# Patient Record
Sex: Female | Born: 1965 | Race: White | Hispanic: No | Marital: Married | Smoking: Never smoker
Health system: Southern US, Community
[De-identification: ages and names within clinical notes are randomized; demographics above are authoritative.]

---

## 1997-11-29 ENCOUNTER — Other Ambulatory Visit: Admission: RE | Admit: 1997-11-29 | Discharge: 1997-11-29 | Payer: Self-pay | Admitting: Gynecology

## 1999-05-09 ENCOUNTER — Other Ambulatory Visit: Admission: RE | Admit: 1999-05-09 | Discharge: 1999-05-09 | Payer: Self-pay | Admitting: Gynecology

## 1999-09-09 ENCOUNTER — Other Ambulatory Visit: Admission: RE | Admit: 1999-09-09 | Discharge: 1999-09-09 | Payer: Self-pay | Admitting: Gynecology

## 1999-09-26 ENCOUNTER — Other Ambulatory Visit: Admission: RE | Admit: 1999-09-26 | Discharge: 1999-09-26 | Payer: Self-pay | Admitting: Gynecology

## 1999-09-26 ENCOUNTER — Encounter (INDEPENDENT_AMBULATORY_CARE_PROVIDER_SITE_OTHER): Payer: Self-pay

## 2000-04-06 ENCOUNTER — Other Ambulatory Visit: Admission: RE | Admit: 2000-04-06 | Discharge: 2000-04-06 | Payer: Self-pay | Admitting: Gynecology

## 2001-08-01 ENCOUNTER — Other Ambulatory Visit: Admission: RE | Admit: 2001-08-01 | Discharge: 2001-08-01 | Payer: Self-pay | Admitting: Gynecology

## 2002-02-03 ENCOUNTER — Ambulatory Visit (HOSPITAL_BASED_OUTPATIENT_CLINIC_OR_DEPARTMENT_OTHER): Admission: RE | Admit: 2002-02-03 | Discharge: 2002-02-03 | Payer: Self-pay | Admitting: Urology

## 2002-02-03 ENCOUNTER — Encounter (INDEPENDENT_AMBULATORY_CARE_PROVIDER_SITE_OTHER): Payer: Self-pay | Admitting: Specialist

## 2002-08-17 ENCOUNTER — Other Ambulatory Visit: Admission: RE | Admit: 2002-08-17 | Discharge: 2002-08-17 | Payer: Self-pay | Admitting: Gynecology

## 2003-01-28 ENCOUNTER — Emergency Department (HOSPITAL_COMMUNITY): Admission: EM | Admit: 2003-01-28 | Discharge: 2003-01-28 | Payer: Self-pay | Admitting: Emergency Medicine

## 2003-09-20 ENCOUNTER — Other Ambulatory Visit: Admission: RE | Admit: 2003-09-20 | Discharge: 2003-09-20 | Payer: Self-pay | Admitting: Gynecology

## 2004-04-03 ENCOUNTER — Encounter: Admission: RE | Admit: 2004-04-03 | Discharge: 2004-04-03 | Payer: Self-pay | Admitting: Gynecology

## 2004-10-20 ENCOUNTER — Other Ambulatory Visit: Admission: RE | Admit: 2004-10-20 | Discharge: 2004-10-20 | Payer: Self-pay | Admitting: Gynecology

## 2005-08-27 ENCOUNTER — Encounter: Admission: RE | Admit: 2005-08-27 | Discharge: 2005-08-27 | Payer: Self-pay | Admitting: Otolaryngology

## 2005-11-10 ENCOUNTER — Other Ambulatory Visit: Admission: RE | Admit: 2005-11-10 | Discharge: 2005-11-10 | Payer: Self-pay | Admitting: Gynecology

## 2006-06-02 ENCOUNTER — Other Ambulatory Visit: Admission: RE | Admit: 2006-06-02 | Discharge: 2006-06-02 | Payer: Self-pay | Admitting: Gynecology

## 2006-07-23 IMAGING — CT CT PARANASAL SINUSES LIMITED
1 series · 16 of 20 positions shown, 20 images · non-contrast
Comparison: none

CLINICAL DATA: Chronic sinusitis symptoms with bilateral maxillary pain.
 LIMITED CT OF PARANASAL SINUSES:
TECHNIQUE: Limited coronal CT images were obtained through the paranasal sinuses without intravenous contrast.
 No comparison.

[Series 2: prone coronal · axial · 0.33mm/px · z∈[+15,+84]mm · 16 of 20 slices shown, 20 images]
[im 2/20  brain]
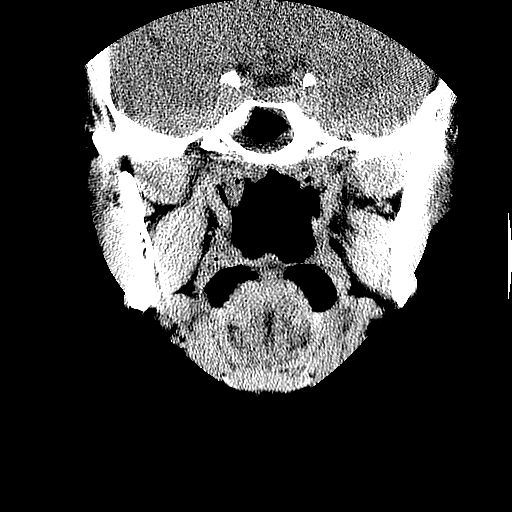
[im 2/20  bone]
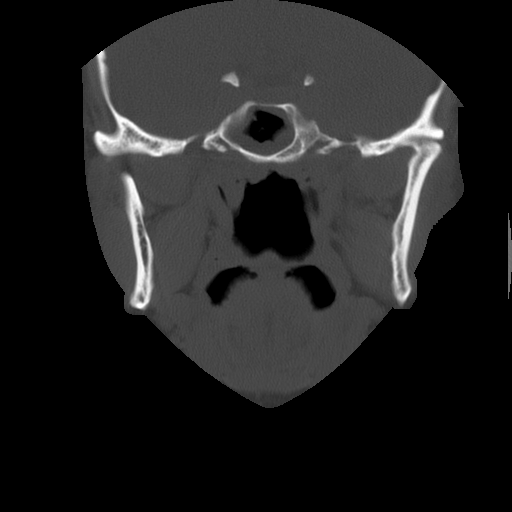
[im 3/20  bone]
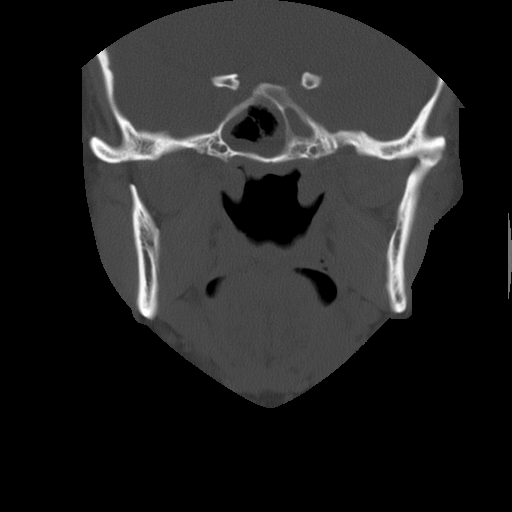
[im 4/20  bone]
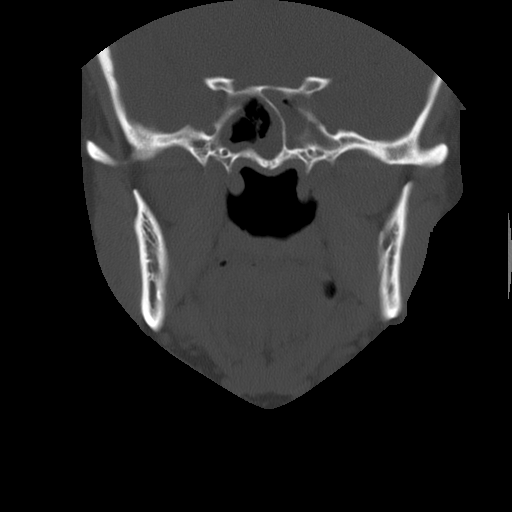
[im 5/20  bone]
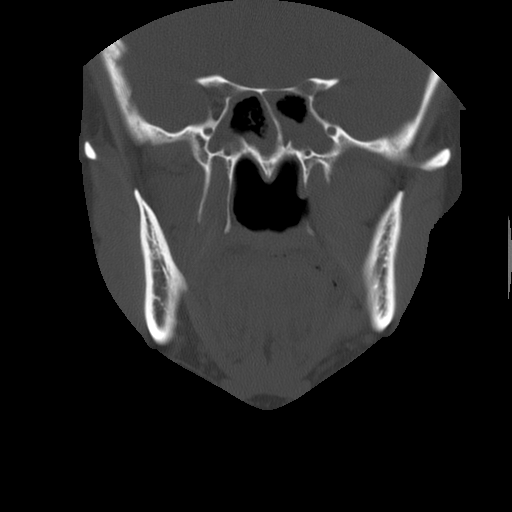
[im 7/20  brain]
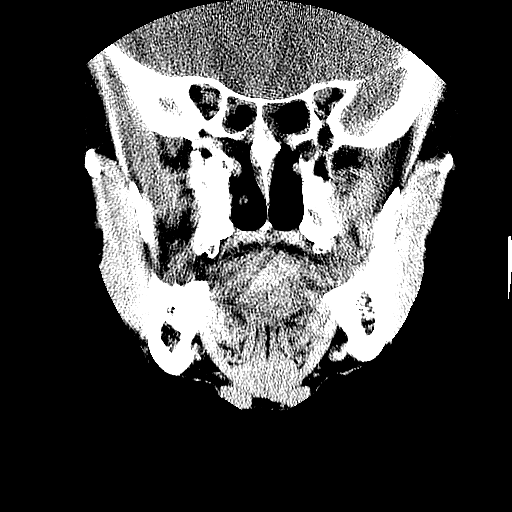
[im 7/20  bone]
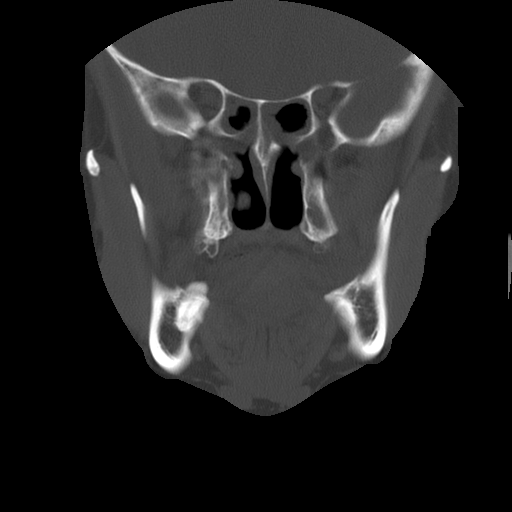
[im 8/20  bone]
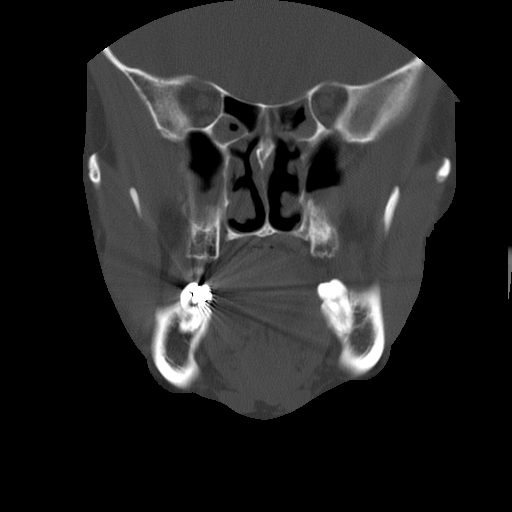
[im 9/20  bone]
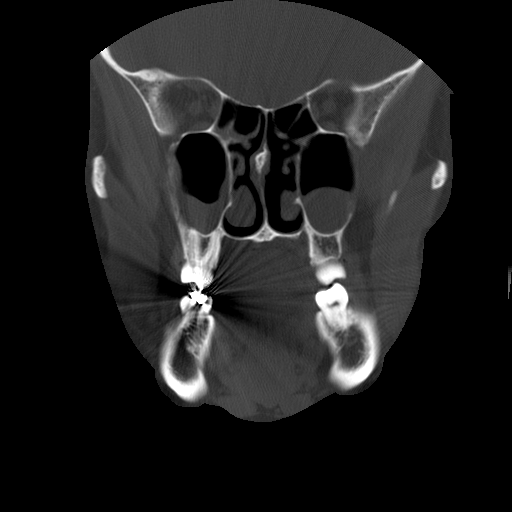
[im 10/20  bone]
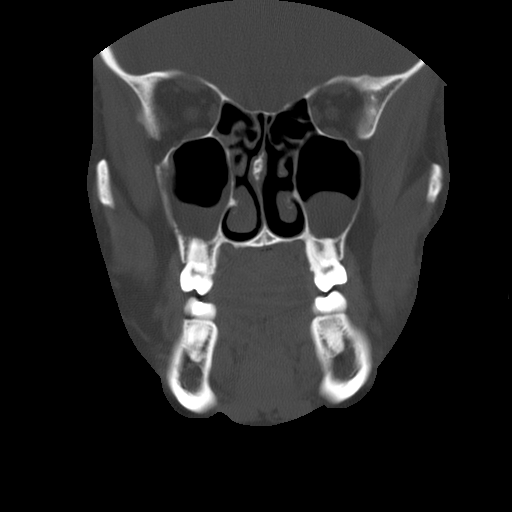
[im 11/20  brain]
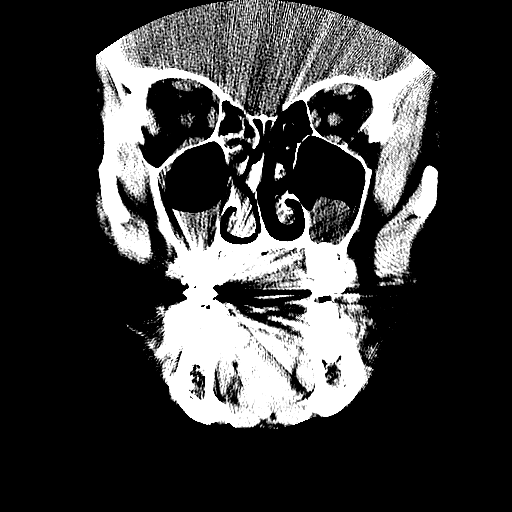
[im 11/20  bone]
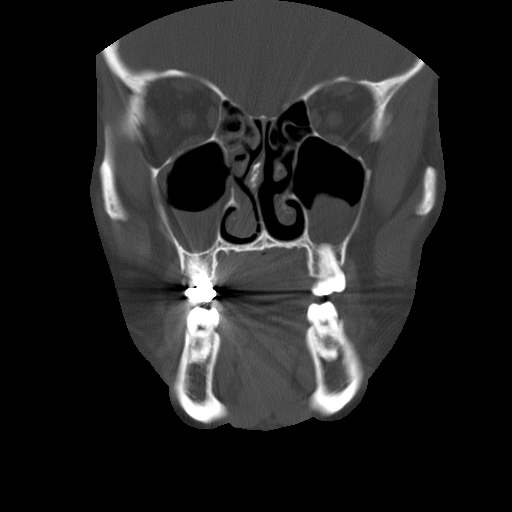
[im 12/20  bone]
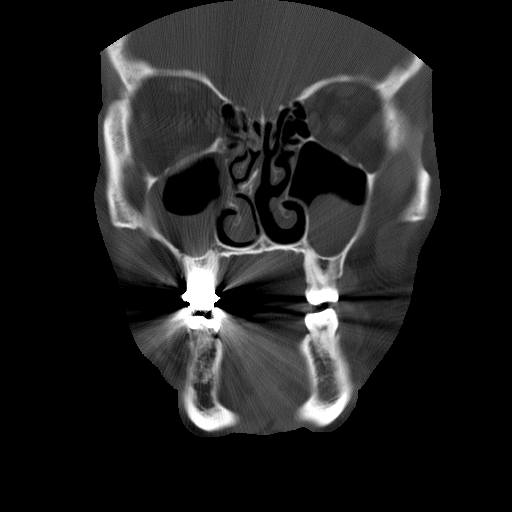
[im 13/20  bone]
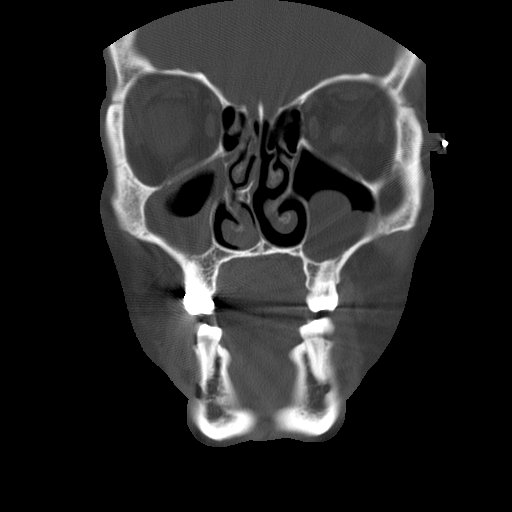
[im 14/20  bone]
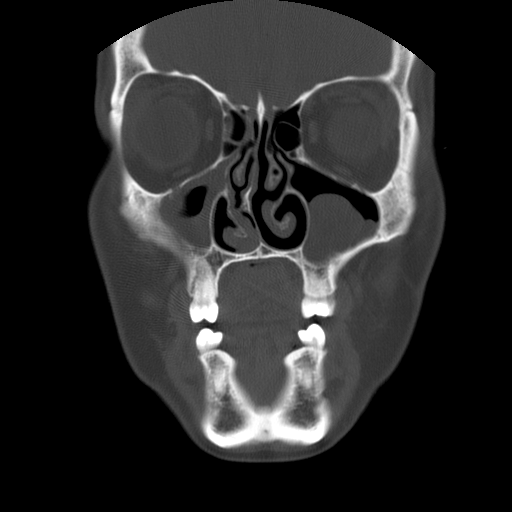
[im 16/20  brain]
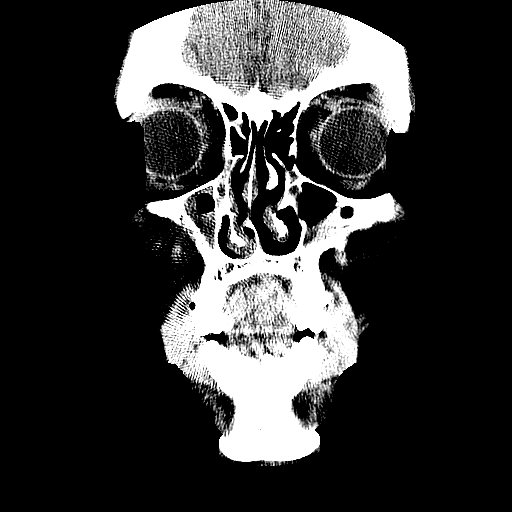
[im 16/20  bone]
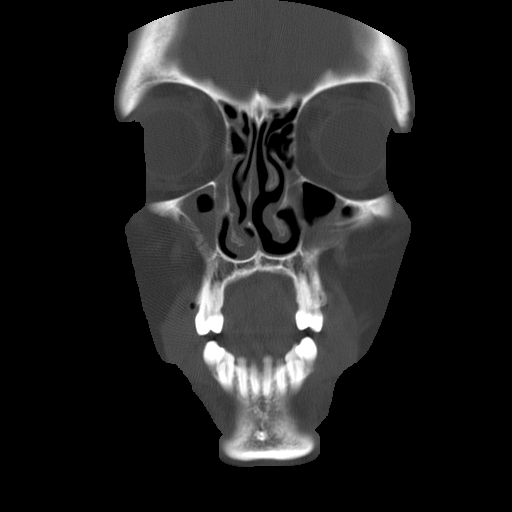
[im 17/20  bone]
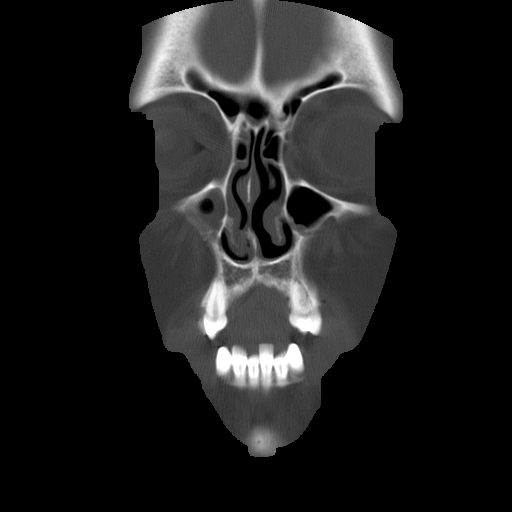
[im 18/20  bone]
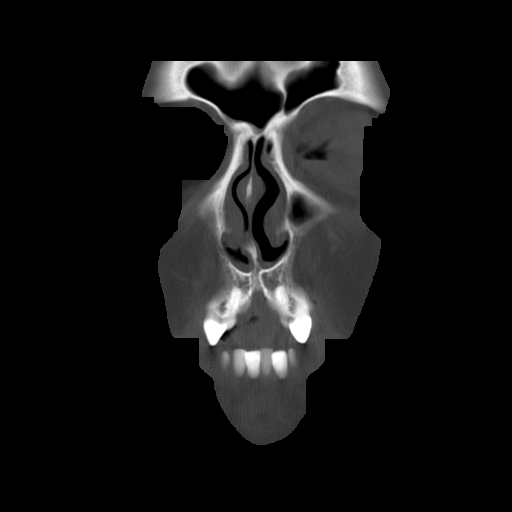
[im 19/20  bone]
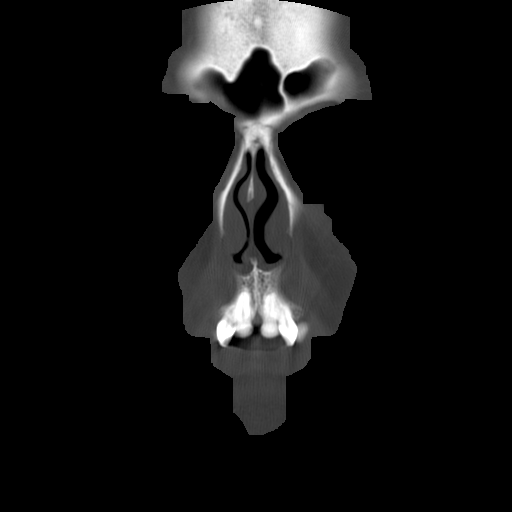

[16 of 20 positions shown; findings below may reference images not displayed]

FINDINGS: Moderate bilateral maxillary with slight to moderate bilateral sphenoid, slight ethmoid and minimal left frontal chronic sinusitis findings are seen.  Slight leftward nasal septal deviation is seen with inflammatory enlargement of the left inferior nasal turbinate.  The right frontal sinus is clear with no other significant abnormality seen.
IMPRESSION: 1.  Slight to moderate chronic pansinusitis most marked at the bilateral maxillary with sparing of the right frontal sinus.
 2.  Slight leftward nasal septal deviation and inflammatory enlargement of left inferior nasal turbinate. 
 3.  Otherwise no significant abnormality.

## 2006-08-27 ENCOUNTER — Emergency Department (HOSPITAL_COMMUNITY): Admission: EM | Admit: 2006-08-27 | Discharge: 2006-08-27 | Payer: Self-pay | Admitting: Family Medicine

## 2007-03-08 ENCOUNTER — Other Ambulatory Visit: Admission: RE | Admit: 2007-03-08 | Discharge: 2007-03-08 | Payer: Self-pay | Admitting: Gynecology

## 2007-08-23 ENCOUNTER — Other Ambulatory Visit: Admission: RE | Admit: 2007-08-23 | Discharge: 2007-08-23 | Payer: Self-pay | Admitting: Gynecology

## 2008-07-11 ENCOUNTER — Other Ambulatory Visit: Admission: RE | Admit: 2008-07-11 | Discharge: 2008-07-11 | Payer: Self-pay | Admitting: Gynecology

## 2010-07-22 ENCOUNTER — Encounter
Admission: RE | Admit: 2010-07-22 | Discharge: 2010-07-22 | Payer: Self-pay | Source: Home / Self Care | Attending: Gynecology | Admitting: Gynecology

## 2010-07-30 ENCOUNTER — Encounter: Payer: Self-pay | Admitting: Gynecology

## 2010-11-14 NOTE — Op Note (Signed)
TNAMEValeta, Paz NO.:  [id number]   MEDICAL RECORD NO.:  [id number]                    PATIENT TYPE:   LOCATION:                                       FACILITY:   PHYSICIAN:  Ronald L. Ovidio Hanger, M.D.           DATE OF BIRTH:   DATE OF PROCEDURE:  02/03/2002  DATE OF DISCHARGE:                                 OPERATIVE REPORT   DIAGNOSIS:  Bladder symptoms, consistent with interstitial cystitis.   OPERATIVE PROCEDURE:  Cystourethroscopy, hydraulic bladder distention, and  bladder biopsy.   SURGEON:  Lucrezia Starch. Earlene Plater, M.D.   ASSISTANT:  Crecencio Mc, M.D.   ANESTHESIA:  General laryngeal airway.   ESTIMATED BLOOD LOSS:  Negligible.   TUBES:  None.   COMPLICATIONS:  None.   INDICATIONS FOR PROCEDURE:  This patient is a lovely 45 year old white  female who presented with symptoms of recurrent urinary tract infection  which has been culture negative.  She has had suprapubic pressure, dysuria,  dyspareunia, and two episodes of urge incontinence, despite having this  negative culture.  She has been having infections on and off since she was  45 years old.  She also has had some right flank pain, although her  ultrasound of her kidneys was normal.  I discussed the situation in detail.  The symptoms are very suspicious for interstitial cystitis, and after  understanding the risks, benefits, and alternatives, she elected to undergo  the above procedure for both therapeutic and diagnostic purposes.   PROCEDURE IN DETAIL:  The patient was placed in the supine position after  proper general laryngeal airway anesthesia.  She was placed in the dorsal  lithotomy position and prepped and draped in a sterile fashion.  A  cystourethroscopy was performed with a 22.5 French Olympus panendoscope,  utilizing the 12- and 70-degree lenses.  The bladder was carefully  inspected.  Efflux of clear urine was noted from the normally placed  ureteral orifices  bilaterally, and there were no lesions in the bladder and  the urethra.  Hydraulic bladder distention was then performed to 80 cm of  water pressure.  She was noted to have a 600 cc capacity bladder, and after  draining, there were definite glomerulations posteriorly and laterally,  trigone, posterior wall, and lateral bladder walls.  The dome appeared to be  spared.  A biopsy was then obtained in the posterior midline with the cold  cup biopsy forceps.  There were glomerulations in this area, and this was  submitted for pathology.  The base was cauterized with Bovie coagulation  cautery.  The bladder was drained.  The panendoscope was removed.  Examination under anesthesia revealed a palpably normal uterus, tubes,  ovaries, etc.  No other lesions were noted.  The patient tolerated the  procedure well and was taken to the recovery room stable.  Ronald L. Ovidio Hanger, M.D.   RLD/MEDQ  D:  02/03/2002  T:  02/05/2002  Job:  [id number]

## 2013-04-03 ENCOUNTER — Other Ambulatory Visit: Payer: Self-pay | Admitting: Gynecology

## 2014-04-30 ENCOUNTER — Other Ambulatory Visit: Payer: Self-pay | Admitting: Gynecology

## 2014-05-01 LAB — CYTOLOGY - PAP

## 2014-11-06 ENCOUNTER — Other Ambulatory Visit: Payer: Self-pay

## 2014-11-07 LAB — CYTOLOGY - PAP

## 2014-11-22 ENCOUNTER — Other Ambulatory Visit: Payer: Self-pay | Admitting: Gynecology

## 2015-03-22 ENCOUNTER — Ambulatory Visit (HOSPITAL_COMMUNITY): Payer: Self-pay | Admitting: Psychiatry

## 2017-04-30 ENCOUNTER — Emergency Department (HOSPITAL_COMMUNITY)
Admission: EM | Admit: 2017-04-30 | Discharge: 2017-04-30 | Disposition: A | Discharge status: Home / Self Care | Payer: BLUE CROSS/BLUE SHIELD | Attending: Emergency Medicine | Admitting: Emergency Medicine

## 2017-04-30 ENCOUNTER — Emergency Department (HOSPITAL_COMMUNITY): Payer: BLUE CROSS/BLUE SHIELD

## 2017-04-30 ENCOUNTER — Encounter (HOSPITAL_COMMUNITY): Payer: Self-pay | Admitting: Emergency Medicine

## 2017-04-30 DIAGNOSIS — M940 Chondrocostal junction syndrome [Tietze]: Secondary | ICD-10-CM | POA: Diagnosis not present

## 2017-04-30 DIAGNOSIS — R0789 Other chest pain: Secondary | ICD-10-CM | POA: Diagnosis present

## 2017-04-30 DIAGNOSIS — R079 Chest pain, unspecified: Secondary | ICD-10-CM

## 2017-04-30 LAB — CBC
HCT: 45.8 % (ref 36.0–46.0)
Hemoglobin: 15.7 g/dL — ABNORMAL HIGH (ref 12.0–15.0)
MCH: 29.7 pg (ref 26.0–34.0)
MCHC: 34.3 g/dL (ref 30.0–36.0)
MCV: 86.7 fL (ref 78.0–100.0)
Platelets: 264 10*3/uL (ref 150–400)
RBC: 5.28 MIL/uL — ABNORMAL HIGH (ref 3.87–5.11)
RDW: 12.9 % (ref 11.5–15.5)
WBC: 7 10*3/uL (ref 4.0–10.5)

## 2017-04-30 LAB — BASIC METABOLIC PANEL
Anion gap: 7 (ref 5–15)
BUN: 14 mg/dL (ref 6–20)
CO2: 26 mmol/L (ref 22–32)
Calcium: 9.7 mg/dL (ref 8.9–10.3)
Chloride: 106 mmol/L (ref 101–111)
Creatinine, Ser: 0.87 mg/dL (ref 0.44–1.00)
GFR calc Af Amer: >60 mL/min (ref >60)
GFR calc non Af Amer: >60 mL/min (ref >60)
Glucose, Bld: 95 mg/dL (ref 65–99)
Potassium: 4.1 mmol/L (ref 3.5–5.1)
Sodium: 139 mmol/L (ref 135–145)

## 2017-04-30 LAB — I-STAT TROPONIN, ED
Troponin i, poc: 0 ng/mL (ref 0.00–0.08)
Troponin i, poc: 0 ng/mL (ref 0.00–0.08)

## 2017-04-30 MED ORDER — MORPHINE SULFATE (PF) 4 MG/ML IV SOLN: 4.0000 mg | Freq: Once | INTRAVENOUS | Status: DC

## 2017-04-30 MED ORDER — GI COCKTAIL ~~LOC~~
30.0000 mL | Freq: Once | ORAL | Status: AC
  Administered 2017-04-30: 30 mL via ORAL
  Filled 2017-04-30: qty 30

## 2017-04-30 NOTE — ED Triage Notes (Signed)
Pt to ER for evaluation of left chest pressure onset yesterday on awakening with nausea. Was seen at PCP and sent here for further evaluation. States took 4 baby aspirin this morning. NAD.

## 2017-04-30 NOTE — ED Notes (Signed)
No relief of symptoms post gi cocktail

## 2017-04-30 NOTE — ED Provider Notes (Signed)
[facility] Note   CSN: [id number] Arrival date & time: 04/30/17  1014     History   Chief Complaint Chief Complaint  Patient presents with  . Chest Pain    HPI Yolanda Warren is a 51 y.o. female with a PMHx of cervical dysplasia, infertility, HLD, diverticulitis, and hiatal hernia, who presents to the ED with complaints of left-sided chest pain that began around 3 AM yesterday while she was sleeping. She states that she woke up with some mild discomfort, went back to sleep, but when she woke back up in the morning it continued to be present so she went to her PCP just prior to arrival and they sent her here for further evaluation. She describes her pain as 4/10 constant heaviness in the left side of her chest with intermittent sharp 8/10 pain also in the left chest, which is nonradiating, with no known aggravating factors, occurring randomly, unchanged with exertion or inspiration, and namely improved with 324 mg aspirin. She has not taken anything else for her symptoms. This morning she had some transient nausea which has completely resolved. States she's been under some stress recently. She is a nonsmoker and takes no medications. She has a positive family history of CAD/CABG in her father. Her PCP is Billee [facility]ashing[facility] [phone]([facility]).   She denies diaphoresis, lightheadedness, fevers, chills, cough, SOB, LE swelling, recent travel/surgery/immobilization, estrogen use, personal/family hx of DVT/PE, abd pain, V/D/C, hematuria, dysuria, myalgias, arthralgias, claudication, orthopnea, numbness, tingling, focal weakness, or any other complaints at this time. Denies recent heavy lifting or activities that could have triggered this.    The history is provided by the patient and medical records. No language interpreter was used.  Chest Pain   This is a new problem. The current episode started yesterday. The problem occurs  constantly. The problem has not changed since onset.The pain is associated with rest. The pain is present in the lateral region. The pain is at a severity of 4/10 (4/10 constant heaviness, 8/10 intermittent sharp). The pain is mild. The quality of the pain is described as heavy and sharp. The pain does not radiate. Duration of episode(s) is 1 day. Exacerbated by: nothing. Associated symptoms include nausea (earlier, which resolved). Pertinent negatives include no abdominal pain, no claudication, no cough, no diaphoresis, no fever, no lower extremity edema, no numbness, no orthopnea, no shortness of breath, no vomiting and no weakness. Treatments tried: 324mg  ASA. The treatment provided mild relief. Risk factors include post-menopausal.  Her past medical history is significant for hyperlipidemia.  Pertinent negatives for past medical history include no diabetes, no DVT, no hypertension and no PE.  Her family medical history is significant for CAD.  Pertinent negatives for family medical history include: no early MI and no PE.    History reviewed. No pertinent past medical history.  There are no active problems to display for this patient.   History reviewed. No pertinent surgical history.  OB History    No data available       Home Medications    Prior to Admission medications   Not on File    Family History History reviewed. No pertinent family history.  Social History Social History  Substance Use Topics  . Smoking status: Never Smoker  . Smokeless tobacco: Never Used  . Alcohol use No     Allergies   Patient has no allergy information on record.   Review of Systems Review  of Systems  Constitutional: Negative for chills, diaphoresis and fever.  Respiratory: Negative for cough and shortness of breath.   Cardiovascular: Positive for chest pain. Negative for orthopnea, claudication and leg swelling.  Gastrointestinal: Positive for nausea (earlier, which resolved).  Negative for abdominal pain, constipation, diarrhea and vomiting.  Genitourinary: Negative for dysuria and hematuria.  Musculoskeletal: Negative for arthralgias and myalgias.  Skin: Negative for color change.  Allergic/Immunologic: Negative for immunocompromised state.  Neurological: Negative for weakness, light-headedness and numbness.  Psychiatric/Behavioral: Negative for confusion.   All other systems reviewed and are negative for acute change except as noted in the HPI.    Physical Exam Updated Vital Signs BP (!) 163/73 (BP Location: Left Arm)   Pulse 68   Temp 98.1 F (36.7 C) (Oral)   SpO2 99%   Physical Exam  Constitutional: She is oriented to person, place, and time. Vital signs are normal. She appears well-developed and well-nourished.  Non-toxic appearance. No distress.  Afebrile, nontoxic, NAD  HENT:  Head: Normocephalic and atraumatic.  Mouth/Throat: Oropharynx is clear and moist and mucous membranes are normal.  Eyes: Conjunctivae and EOM are normal. Right eye exhibits no discharge. Left eye exhibits no discharge.  Neck: Normal range of motion. Neck supple.  Cardiovascular: Normal rate, regular rhythm, normal heart sounds and intact distal pulses.  Exam reveals no gallop and no friction rub.   No murmur heard. RRR, nl s1/s2, no m/r/g, distal pulses intact, no pedal edema   Pulmonary/Chest: Effort normal and breath sounds normal. No respiratory distress. She has no decreased breath sounds. She has no wheezes. She has no rhonchi. She has no rales. She exhibits tenderness. She exhibits no crepitus, no deformity and no retraction.    CTAB in all lung fields, no w/r/r, no hypoxia or increased WOB, speaking in full sentences, SpO2 99% on RA Chest wall with mild L anterior TTP, without crepitus, deformities, or retractions   Abdominal: Soft. Normal appearance and bowel sounds are normal. She exhibits no distension. There is no tenderness. There is no rigidity, no rebound, no  guarding, no CVA tenderness, no tenderness at McBurney's point and negative Murphy's sign.  Musculoskeletal: Normal range of motion.  MAE x4 Strength and sensation grossly intact in all extremities Distal pulses intact No pedal edema, neg homan's bilaterally   Neurological: She is alert and oriented to person, place, and time. She has normal strength. No sensory deficit.  Skin: Skin is warm, dry and intact. No rash noted.  Psychiatric: She has a normal mood and affect.  Nursing note and vitals reviewed.    ED Treatments / Results  Labs (all labs ordered are listed, but only abnormal results are displayed) Labs Reviewed  CBC - Abnormal; Notable for the following:       Result Value   RBC 5.28 (*)    Hemoglobin 15.7 (*)    All other components within normal limits  BASIC METABOLIC PANEL  I-STAT TROPONIN, ED  I-STAT TROPONIN, ED    EKG  EKG Interpretation  Date/Time:  Friday April 30 2017 10:19:18 EDT Ventricular Rate:  72 PR Interval:  158 QRS Duration: 88 QT Interval:  396 QTC Calculation: 433 R Axis:   72 Text Interpretation:  Normal sinus rhythm Normal ECG Confirmed by Bethann Berkshire [phone]) on 04/30/2017 11:09:01 AM       Radiology Dg Chest 2 View  Result Date: 04/30/2017 CLINICAL DATA:  Chest pain. EXAM: CHEST  2 VIEW COMPARISON:  None. FINDINGS: The heart size and  mediastinal contours are within normal limits. Both lungs are clear. No pneumothorax or pleural effusion is noted. The visualized skeletal structures are unremarkable. IMPRESSION: No active cardiopulmonary disease. Electronically Signed   By: Lupita Raider, M.D.   On: 04/30/2017 10:56    Procedures Procedures (including critical care time)  Medications Ordered in ED Medications  gi cocktail (Maalox,Lidocaine,Donnatal) (30 mLs Oral Given 04/30/17 1235)     Initial Impression / Assessment and Plan / ED Course  I have reviewed the triage vital signs and the nursing notes.  Pertinent labs &  imaging results that were available during my care of the patient were reviewed by me and considered in my medical decision making (see chart for details).     51 y.o. female here with CP x1 day with transient nausea this morning which subsequently resolved. On exam, mild L chest wall TTP, no tachycardia or hypoxia, no LE swelling, clear lung sounds, overall reassuring exam. EKG completely unremarkable. Trop neg, CBC essentially WNL, BMP WNL, CXR neg. Likely musculoskeletal pain, but will get second trop at 3hr mark to ensure no elevations; if negative then could likely d/c. Will give GI cocktail, pt declined morphine or anything else for pain. Will reassess shortly.   2:46 PM Second trop neg. Pt feeling well, GI cocktail didn't do much for her pain, but overall she still feels ok. Overall reassuring work up, low HEART score (although does have some family risk factors), and reproducible pain on exam; likely musculoskeletal, doubt need for further emergent work up today. Advised tylenol/motrin/heat for pain, other OTC remedies for symptomatic control, and f/up with PCP in 5-7 days for recheck and further outpatient evaluation. I explained the diagnosis and have given explicit precautions to return to the ER including for any other new or worsening symptoms. The patient understands and accepts the medical plan as it's been dictated and I have answered their questions. Discharge instructions concerning home care and prescriptions have been given. The patient is STABLE and is discharged to home in good condition.    Final Clinical Impressions(s) / ED Diagnoses   Final diagnoses:  Left sided chest pain  Chest wall pain  Costochondritis    New Prescriptions New Prescriptions   No medications on [address], [place], [place] 04/30/17 1447    Bethann Berkshire, MD 05/01/17 1158

## 2017-04-30 NOTE — Discharge Instructions (Signed)
Your labs, xray, and EKG are all reassuring, your chest pain could be a variety of things including gas pain, muscle pain, and other nonemergent issues. Alternate between tylenol and ibuprofen as directed as needed for pain. Stay well hydrated. You may consider using heat to the areas of pain, no more than 20 minutes every hour. Follow up with your regular doctor in 5-7 days for recheck of symptoms. Return to the ER for changes or worsening symptoms.  SEEK IMMEDIATE MEDICAL ATTENTION IF: You develop a fever.  Your chest pains become severe or intolerable.  You develop new, unexplained symptoms (problems).  You develop shortness of breath, nausea, vomiting, sweating or feel light headed.  You develop a new cough or you cough up blood. You develop new leg swelling

## 2017-04-30 NOTE — ED Notes (Signed)
Pt ambulated from waiting room to room, tolerated well. 

## 2018-03-26 IMAGING — DX DG CHEST 2V
2 series · 2 of 2 positions shown · non-contrast
Comparison: None.

CLINICAL DATA: Chest pain.

EXAM:
CHEST  2 VIEW

[chest pa]
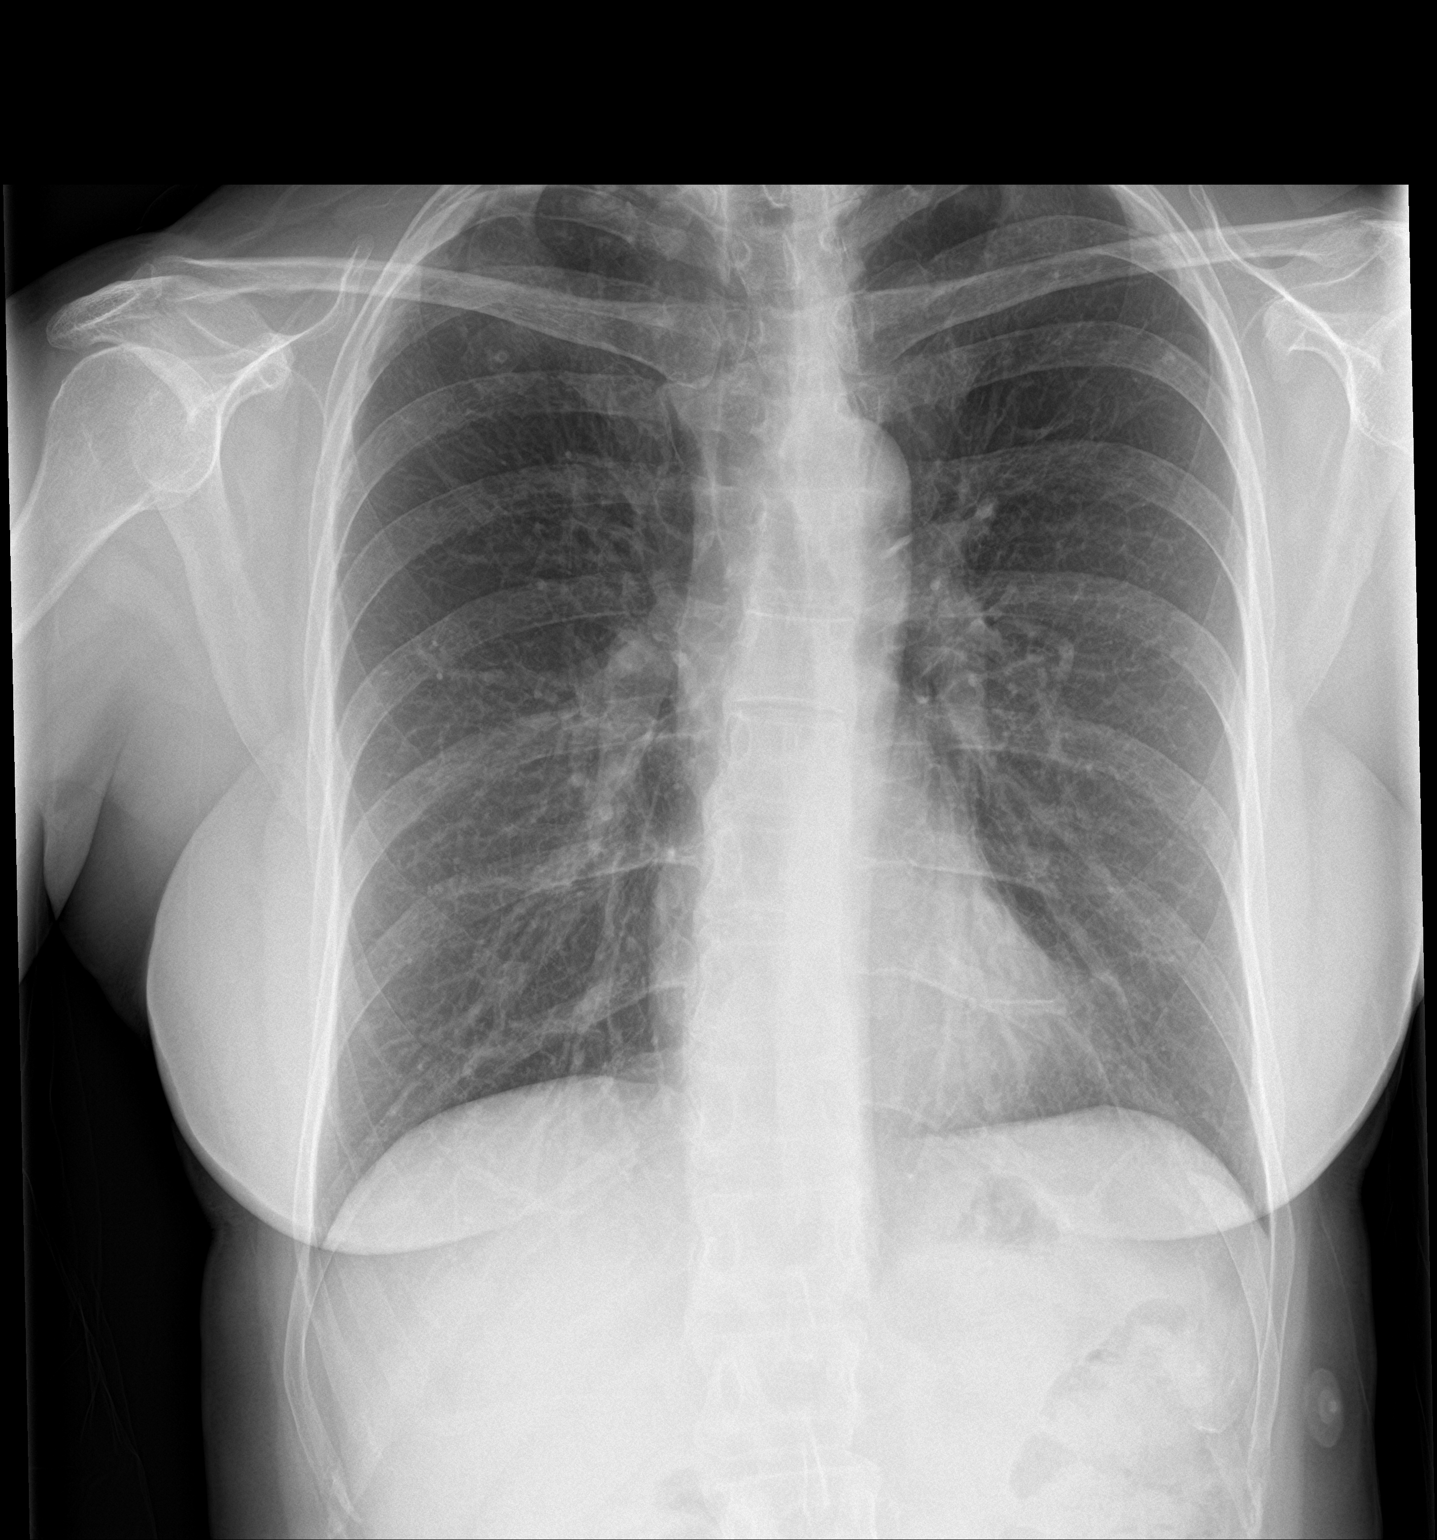

[chest lat]
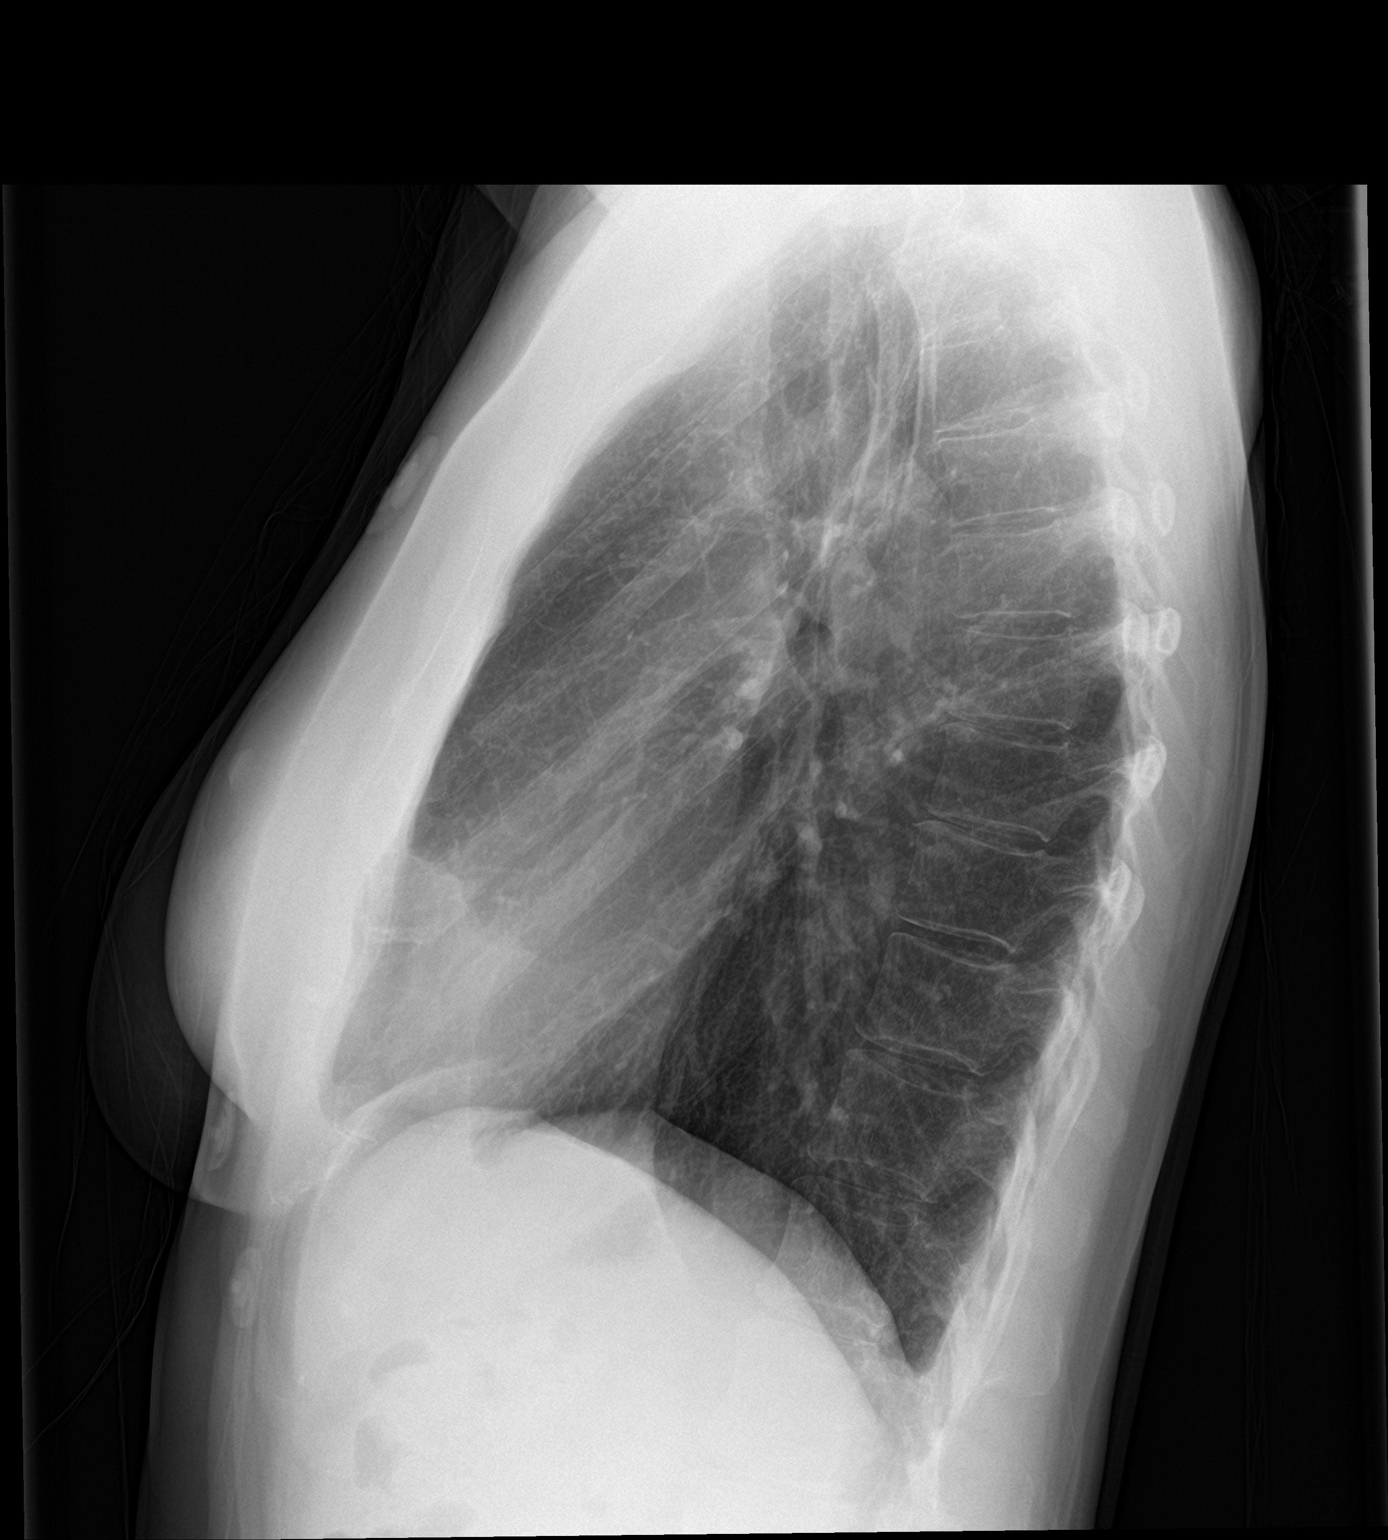

[2 of 2 positions shown; findings below may reference images not displayed]

FINDINGS: The heart size and mediastinal contours are within normal limits.
Both lungs are clear. No pneumothorax or pleural effusion is noted.
The visualized skeletal structures are unremarkable.
IMPRESSION: No active cardiopulmonary disease.
